# Patient Record
Sex: Male | Born: 2005
Health system: Southern US, Community
[De-identification: ages and names within clinical notes are randomized; demographics above are authoritative.]

## PROBLEM LIST (undated history)

## (undated) DIAGNOSIS — F909 Attention-deficit hyperactivity disorder, unspecified type: Secondary | ICD-10-CM

## (undated) DIAGNOSIS — R109 Unspecified abdominal pain: Secondary | ICD-10-CM

## (undated) HISTORY — DX: Unspecified abdominal pain: R10.9

## (undated) HISTORY — PX: TYMPANOSTOMY TUBE PLACEMENT: SHX32

---
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
## 2024-03-24 ENCOUNTER — Other Ambulatory Visit (HOSPITAL_COMMUNITY): Payer: Self-pay
